# Patient Record
Sex: Male | Born: 1960 | Race: White | Hispanic: No | Smoking: Current every day smoker
Health system: Southern US, Community
[De-identification: ages and names within clinical notes are randomized; demographics above are authoritative.]

## PROBLEM LIST (undated history)

## (undated) DIAGNOSIS — E7800 Pure hypercholesterolemia, unspecified: Secondary | ICD-10-CM

## (undated) DIAGNOSIS — F0390 Unspecified dementia without behavioral disturbance: Secondary | ICD-10-CM

## (undated) HISTORY — PX: NO PAST SURGERIES: SHX2092

---

## 2010-11-11 ENCOUNTER — Emergency Department: Payer: Self-pay | Admitting: Emergency Medicine

## 2011-02-07 ENCOUNTER — Emergency Department: Payer: Self-pay | Admitting: Unknown Physician Specialty

## 2013-11-27 ENCOUNTER — Ambulatory Visit: Payer: Self-pay | Admitting: Family Medicine

## 2014-04-03 ENCOUNTER — Ambulatory Visit: Payer: Self-pay | Admitting: Neurology

## 2016-01-26 ENCOUNTER — Other Ambulatory Visit: Payer: Self-pay | Admitting: Family Medicine

## 2016-01-26 DIAGNOSIS — M8580 Other specified disorders of bone density and structure, unspecified site: Secondary | ICD-10-CM

## 2016-03-01 ENCOUNTER — Ambulatory Visit: Payer: Self-pay | Attending: Family Medicine

## 2017-04-19 ENCOUNTER — Other Ambulatory Visit: Payer: Self-pay | Admitting: Nurse Practitioner

## 2017-04-19 DIAGNOSIS — F0391 Unspecified dementia with behavioral disturbance: Secondary | ICD-10-CM

## 2017-05-23 ENCOUNTER — Ambulatory Visit: Payer: Self-pay

## 2017-06-27 ENCOUNTER — Ambulatory Visit: Payer: Self-pay

## 2017-10-17 ENCOUNTER — Other Ambulatory Visit: Payer: Self-pay | Admitting: Family Medicine

## 2017-10-17 ENCOUNTER — Ambulatory Visit
Admission: RE | Admit: 2017-10-17 | Discharge: 2017-10-17 | Disposition: A | Discharge status: Home / Self Care | Payer: Medicare Other | Source: Ambulatory Visit | Attending: Family Medicine | Admitting: Family Medicine

## 2017-10-17 DIAGNOSIS — M25562 Pain in left knee: Secondary | ICD-10-CM | POA: Insufficient documentation

## 2017-10-17 DIAGNOSIS — G8929 Other chronic pain: Secondary | ICD-10-CM

## 2018-09-11 ENCOUNTER — Emergency Department
Admission: EM | Admit: 2018-09-11 | Discharge: 2018-09-11 | Disposition: A | Discharge status: Home / Self Care | Payer: Medicare Other | Attending: Emergency Medicine | Admitting: Emergency Medicine

## 2018-09-11 ENCOUNTER — Other Ambulatory Visit: Payer: Self-pay

## 2018-09-11 ENCOUNTER — Emergency Department: Payer: Medicare Other

## 2018-09-11 ENCOUNTER — Encounter: Payer: Self-pay | Admitting: Emergency Medicine

## 2018-09-11 DIAGNOSIS — K047 Periapical abscess without sinus: Secondary | ICD-10-CM | POA: Insufficient documentation

## 2018-09-11 DIAGNOSIS — R220 Localized swelling, mass and lump, head: Secondary | ICD-10-CM | POA: Diagnosis present

## 2018-09-11 DIAGNOSIS — Z7982 Long term (current) use of aspirin: Secondary | ICD-10-CM | POA: Insufficient documentation

## 2018-09-11 DIAGNOSIS — F0390 Unspecified dementia without behavioral disturbance: Secondary | ICD-10-CM | POA: Diagnosis not present

## 2018-09-11 DIAGNOSIS — Z79899 Other long term (current) drug therapy: Secondary | ICD-10-CM | POA: Insufficient documentation

## 2018-09-11 HISTORY — DX: Pure hypercholesterolemia, unspecified: E78.00

## 2018-09-11 HISTORY — DX: Unspecified dementia, unspecified severity, without behavioral disturbance, psychotic disturbance, mood disturbance, and anxiety: F03.90

## 2018-09-11 LAB — CBC WITH DIFFERENTIAL/PLATELET
Abs Immature Granulocytes: 0.06 10*3/uL (ref 0.00–0.07)
Basophils Absolute: 0.1 10*3/uL (ref 0.0–0.1)
Basophils Relative: 0 %
Eosinophils Absolute: 0 10*3/uL (ref 0.0–0.5)
Eosinophils Relative: 0 %
HCT: 41.1 % (ref 39.0–52.0)
Hemoglobin: 13.8 g/dL (ref 13.0–17.0)
Immature Granulocytes: 0 %
Lymphocytes Relative: 20 %
Lymphs Abs: 2.8 10*3/uL (ref 0.7–4.0)
MCH: 31.4 pg (ref 26.0–34.0)
MCHC: 33.6 g/dL (ref 30.0–36.0)
MCV: 93.4 fL (ref 80.0–100.0)
Monocytes Absolute: 1 10*3/uL (ref 0.1–1.0)
Monocytes Relative: 7 %
Neutro Abs: 10.1 10*3/uL — ABNORMAL HIGH (ref 1.7–7.7)
Neutrophils Relative %: 73 %
Platelets: 285 10*3/uL (ref 150–400)
RBC: 4.4 MIL/uL (ref 4.22–5.81)
RDW: 13.1 % (ref 11.5–15.5)
WBC: 14 10*3/uL — ABNORMAL HIGH (ref 4.0–10.5)
nRBC: 0 % (ref 0.0–0.2)

## 2018-09-11 LAB — BASIC METABOLIC PANEL
Anion gap: 11 (ref 5–15)
BUN: 12 mg/dL (ref 6–20)
CO2: 20 mmol/L — ABNORMAL LOW (ref 22–32)
Calcium: 9.3 mg/dL (ref 8.9–10.3)
Chloride: 107 mmol/L (ref 98–111)
Creatinine, Ser: 1.4 mg/dL — ABNORMAL HIGH (ref 0.61–1.24)
GFR calc Af Amer: >60 mL/min (ref >60)
GFR calc non Af Amer: 56 mL/min — ABNORMAL LOW (ref >60)
Glucose, Bld: 91 mg/dL (ref 70–99)
Potassium: 3.6 mmol/L (ref 3.5–5.1)
Sodium: 138 mmol/L (ref 135–145)

## 2018-09-11 MED ORDER — CLINDAMYCIN HCL 300 MG PO CAPS: 300.0000 mg | ORAL_CAPSULE | Freq: Three times a day (TID) | ORAL | 0 refills | Status: AC

## 2018-09-11 MED ORDER — CLINDAMYCIN PHOSPHATE 600 MG/50ML IV SOLN
600.0000 mg | Freq: Once | INTRAVENOUS | Status: AC
  Administered 2018-09-11: 600 mg via INTRAVENOUS
  Filled 2018-09-11: qty 50

## 2018-09-11 MED ORDER — IOHEXOL 300 MG/ML  SOLN
75.0000 mL | Freq: Once | INTRAMUSCULAR | Status: AC | PRN
  Administered 2018-09-11: 75 mL via INTRAVENOUS
  Filled 2018-09-11: qty 75

## 2018-09-11 NOTE — ED Notes (Signed)
See triage note  Here for left side facial swelling    Thinks he may have an abscess tooth  No fever

## 2018-09-11 NOTE — ED Provider Notes (Signed)
[facility] Emergency Department Provider Note  ____________________________________________  Time seen: Approximately 5:18 PM  I have reviewed the triage vital signs and the nursing notes.   HISTORY  Chief Complaint Facial Swelling    HPI John Mclean is a 58 y.o. male presents to the emergency department with left-sided facial edema and erythema for the past 2-3 days.  Patient reports that he has a broken Superior 14 and has been unable to chew on the affected side.  Patient noticed redness of the left cheek that has extended upward to the left-sided periocular skin.  Patient has had no pain with extraocular eye muscle movement or pressure behind the eye.  He denies increased tearing, photophobia or changes in vision.  Patient has no history of periorbital cellulitis. No alleviating measures have been attempted.    Past Medical History:  Diagnosis Date  . Dementia ([place])   . Hypercholesteremia     There are no active problems to display for this patient.   History reviewed. No pertinent surgical history.  Prior to Admission medications   Medication Sig Start Date End Date Taking? Authorizing Provider  aspirin 81 MG chewable tablet Chew 81 mg by mouth daily.   Yes [provider]  atorvastatin (LIPITOR) 40 MG tablet Take 40 mg by mouth daily.   Yes [provider]  donepezil (ARICEPT) 10 MG tablet Take 10 mg by mouth at bedtime.   Yes [provider]  lamoTRIgine (LAMICTAL) 100 MG tablet Take 100 mg by mouth daily.   Yes [provider]  clindamycin (CLEOCIN) 300 MG capsule Take 1 capsule (300 mg total) by mouth 3 (three) times daily for 10 days. 09/11/18 09/21/18  Lannie Fields, PA-C    Allergies Patient has no known allergies.  History reviewed. No pertinent family history.  Social History Social History   Tobacco Use  . Smoking status: Never Smoker  . Smokeless tobacco: Never Used  Substance Use  Topics  . Alcohol use: Never    Frequency: Never  . Drug use: Never     Review of Systems  Constitutional: No fever/chills Eyes: No visual changes. No discharge ENT: Patient has dental pain and facial swelling.  Cardiovascular: no chest pain. Respiratory: no cough. No SOB. Gastrointestinal: No abdominal pain.  No nausea, no vomiting.  No diarrhea.  No constipation. Musculoskeletal: Negative for musculoskeletal pain. Skin: Patient has facial erythema.  Neurological: Negative for headaches, focal weakness or numbness.  ____________________________________________   PHYSICAL EXAM:  VITAL SIGNS: ED Triage Vitals  Enc Vitals Group     BP 09/11/18 1618 124/68     Pulse Rate 09/11/18 1618 100     Resp 09/11/18 1618 18     Temp 09/11/18 1618 98.8 F (37.1 C)     Temp Source 09/11/18 1618 Oral     SpO2 09/11/18 1618 95 %     Weight 09/11/18 1615 220 lb (99.8 kg)     Height 09/11/18 1615 5\' 10"  (1.778 m)     Head Circumference --      Peak Flow --      Pain Score 09/11/18 1614 8     Pain Loc --      Pain Edu? --      Excl. in [place]? --      Constitutional: Alert and oriented. Well appearing and in no acute distress. Eyes: Conjunctivae are normal. No pain with extraocular eye muscle movement.  PERRL. EOMI. Head: Atraumatic. ENT:  Ears: TMs are pearly.      Nose: No congestion/rhinnorhea.      Mouth/Throat: Mucous membranes are moist.  Patient has broken superior 14.  Patient has left-sided facial swelling and erythema. Neck: No stridor.  No cervical spine tenderness to palpation. Cardiovascular: Normal rate, regular rhythm. Normal S1 and S2.  Good peripheral circulation. Respiratory: Normal respiratory effort without tachypnea or retractions. Lungs CTAB. Good air entry to the bases with no decreased or absent breath sounds. Gastrointestinal: Bowel sounds 4 quadrants. Soft and nontender to palpation. No guarding or rigidity. No palpable masses. No distention. No CVA  tenderness. Musculoskeletal: Full range of motion to all extremities. No gross deformities appreciated. Neurologic:  Normal speech and language. No gross focal neurologic deficits are appreciated.  Psychiatric: Mood and affect are normal. Speech and behavior are normal. Patient exhibits appropriate insight and judgement.   ____________________________________________   LABS (all labs ordered are listed, but only abnormal results are displayed)  Labs Reviewed  CBC WITH DIFFERENTIAL/PLATELET - Abnormal; Notable for the following components:      Result Value   WBC 14.0 (*)    Neutro Abs 10.1 (*)    All other components within normal limits  BASIC METABOLIC PANEL - Abnormal; Notable for the following components:   CO2 20 (*)    Creatinine, Ser 1.40 (*)    GFR calc non Af Amer 56 (*)    All other components within normal limits   ____________________________________________  EKG   ____________________________________________  RADIOLOGY I personally viewed and evaluated these images as part of my medical decision making, as well as reviewing the written report by the radiologist.    Ct Maxillofacial W Contrast  Result Date: 09/11/2018 CLINICAL DATA:  Facial swelling EXAM: CT MAXILLOFACIAL WITH CONTRAST TECHNIQUE: Multidetector CT imaging of the maxillofacial structures was performed with intravenous contrast. Multiplanar CT image reconstructions were also generated. CONTRAST:  63mL OMNIPAQUE IOHEXOL 300 MG/ML  SOLN COMPARISON:  None. FINDINGS: Osseous: Poor dentition with multiple periapical erosions, most notably tooth number 12 which has an associated subperiosteal abscess on the buccal surface measuring 4 mm in thickness by 16 mm in length. There is regional facial cellulitis. No floor of mouth swelling. Orbits: Negative Sinuses: Mucosal thickening on the floor of the left maxillary sinus, potentially odontogenic. Right maxillary sclerotic wall thickening from prior inflammation.  Soft tissues: Cellulitis in the left face as noted above Limited intracranial: Age advanced cerebral volume loss. IMPRESSION: Odontogenic infection in the left face related to tooth 12 with 16 x 4 mm subperiosteal abscess on the buccal surface. Electronically Signed   By: Monte Fantasia M.D.   On: 09/11/2018 18:44    ____________________________________________    PROCEDURES  Procedure(s) performed:    Procedures    Medications  clindamycin (CLEOCIN) IVPB 600 mg (0 mg Intravenous Stopped 09/11/18 1826)  iohexol (OMNIPAQUE) 300 MG/ML solution 75 mL (75 mLs Intravenous Contrast Given 09/11/18 1826)     ____________________________________________   INITIAL IMPRESSION / ASSESSMENT AND PLAN / ED COURSE  Pertinent labs & imaging results that were available during my care of the patient were reviewed by me and considered in my medical decision making (see chart for details).  Review of the [place] CSRS was performed in accordance of the [place] prior to dispensing any controlled drugs.      Assessment and plan Dental abscess Facial cellulitis Patient presents to the emergency department with dental pain surrounding superior 12 with left-sided facial cellulitis.  Patient has had symptoms  for the past 2 to 3 days.  CT maxillofacial reveals a subperiosteal abscess surrounding superior 12.  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  Patient was given IV clindamycin in the emergency department.  He was discharged with clindamycin.  Vital signs were reassuring prior to discharge.  All patient questions were answered.     ____________________________________________  FINAL CLINICAL IMPRESSION(S) / ED DIAGNOSES  Final diagnoses:  Dental abscess      NEW MEDICATIONS STARTED DURING THIS VISIT:  ED Discharge Orders         Ordered    clindamycin (CLEOCIN) 300 MG capsule  3 times daily     09/11/18 1941              This chart was dictated using voice recognition  software/Dragon. Despite best efforts to proofread, errors can occur which can change the meaning. Any change was purely unintentional.    Lannie Fields, PA-C 09/11/18 Elnita Maxwell, MD 09/12/18 [phone]

## 2018-09-11 NOTE — Discharge Instructions (Signed)
Take clindamycin 3 times a day at breakfast, lunch and dinner.  Please consume yogurt often while taking clindamycin. A probiotic that you can find at [facility], [facility], [facility] or [facility] is often helpful for reintroducing good gut bacteria back into your system.

## 2018-09-11 NOTE — ED Triage Notes (Signed)
Here for left facial swelling. Appears to have abscess tooth that has spread into cheek. No fever. No pain in eye. No vision changes. Does not appear to have periorbital cellulitis symptoms at this time.  Ambulatory. Some mild dementia. Family with pt.

## 2018-10-30 ENCOUNTER — Other Ambulatory Visit: Payer: Self-pay

## 2018-10-30 ENCOUNTER — Encounter: Payer: Self-pay | Admitting: Family Medicine

## 2018-10-30 DIAGNOSIS — Z1211 Encounter for screening for malignant neoplasm of colon: Secondary | ICD-10-CM

## 2018-11-05 ENCOUNTER — Telehealth: Payer: Self-pay | Admitting: Gastroenterology

## 2018-11-05 NOTE — Telephone Encounter (Signed)
Pt mother  Left vm regarding pt up coming procedure please call her

## 2018-11-05 NOTE — Telephone Encounter (Signed)
Patients mother contacted office to cancel her son's colonoscopy due to insurance concerns.  She said she needs to call the insurance company to see if they will cover the procedure before having it done.  She will call back to schedule her son's procedure once she checks with the insurance.  Thanks [employer]

## 2018-11-07 ENCOUNTER — Telehealth: Payer: Self-pay | Admitting: Gastroenterology

## 2018-11-07 NOTE — Telephone Encounter (Signed)
Returned [employer].  Left message with Staci to let her know I returned the call and she can call me back when she gets back home.  Thanks [employer]

## 2018-11-07 NOTE — Telephone Encounter (Signed)
Pt mother left vm to r/s pt procedure she ask that [place] call her back

## 2018-11-15 ENCOUNTER — Other Ambulatory Visit: Payer: Self-pay

## 2018-11-15 ENCOUNTER — Telehealth: Payer: Self-pay | Admitting: Gastroenterology

## 2018-11-15 DIAGNOSIS — Z1211 Encounter for screening for malignant neoplasm of colon: Secondary | ICD-10-CM

## 2018-11-15 MED ORDER — NA SULFATE-K SULFATE-MG SULF 17.5-3.13-1.6 GM/177ML PO SOLN: 1.0000 | Freq: Once | ORAL | 0 refills | Status: AC

## 2018-11-15 NOTE — Telephone Encounter (Signed)
SHARON LEFT VM TO SCHEDULE PT PROCEDURE

## 2018-11-18 ENCOUNTER — Ambulatory Visit: Admit: 2018-11-18 | Payer: Medicare Other | Admitting: Gastroenterology

## 2018-11-18 SURGERY — COLONOSCOPY WITH PROPOFOL
Anesthesia: Choice

## 2018-11-20 ENCOUNTER — Encounter: Payer: Self-pay | Admitting: Anesthesiology

## 2018-11-20 ENCOUNTER — Other Ambulatory Visit: Payer: Self-pay

## 2018-11-20 ENCOUNTER — Encounter: Payer: Self-pay | Admitting: *Deleted

## 2018-11-27 ENCOUNTER — Other Ambulatory Visit: Payer: Self-pay

## 2018-11-27 DIAGNOSIS — K625 Hemorrhage of anus and rectum: Secondary | ICD-10-CM

## 2018-11-29 ENCOUNTER — Encounter
Admission: RE | Disposition: A | Discharge status: Home / Self Care | Payer: Self-pay | Source: Home / Self Care | Attending: Gastroenterology

## 2018-11-29 ENCOUNTER — Ambulatory Visit: Payer: Medicare Other | Admitting: Anesthesiology

## 2018-11-29 ENCOUNTER — Ambulatory Visit: Admission: RE | Admit: 2018-11-29 | Payer: Medicare Other | Source: Home / Self Care | Admitting: Gastroenterology

## 2018-11-29 ENCOUNTER — Encounter: Payer: Self-pay | Admitting: *Deleted

## 2018-11-29 ENCOUNTER — Ambulatory Visit
Admission: RE | Admit: 2018-11-29 | Discharge: 2018-11-29 | Disposition: A | Discharge status: Home / Self Care | Payer: Medicare Other | Attending: Gastroenterology | Admitting: Gastroenterology

## 2018-11-29 DIAGNOSIS — F17210 Nicotine dependence, cigarettes, uncomplicated: Secondary | ICD-10-CM | POA: Insufficient documentation

## 2018-11-29 DIAGNOSIS — Z79899 Other long term (current) drug therapy: Secondary | ICD-10-CM | POA: Insufficient documentation

## 2018-11-29 DIAGNOSIS — K921 Melena: Secondary | ICD-10-CM | POA: Diagnosis present

## 2018-11-29 DIAGNOSIS — D122 Benign neoplasm of ascending colon: Secondary | ICD-10-CM | POA: Diagnosis not present

## 2018-11-29 DIAGNOSIS — Z7982 Long term (current) use of aspirin: Secondary | ICD-10-CM | POA: Diagnosis not present

## 2018-11-29 DIAGNOSIS — F0390 Unspecified dementia without behavioral disturbance: Secondary | ICD-10-CM | POA: Insufficient documentation

## 2018-11-29 DIAGNOSIS — K5730 Diverticulosis of large intestine without perforation or abscess without bleeding: Secondary | ICD-10-CM | POA: Insufficient documentation

## 2018-11-29 DIAGNOSIS — K641 Second degree hemorrhoids: Secondary | ICD-10-CM | POA: Insufficient documentation

## 2018-11-29 DIAGNOSIS — E7800 Pure hypercholesterolemia, unspecified: Secondary | ICD-10-CM | POA: Insufficient documentation

## 2018-11-29 DIAGNOSIS — K625 Hemorrhage of anus and rectum: Secondary | ICD-10-CM

## 2018-11-29 HISTORY — PX: COLONOSCOPY WITH PROPOFOL: SHX5780

## 2018-11-29 SURGERY — COLONOSCOPY WITH PROPOFOL
Anesthesia: Choice

## 2018-11-29 SURGERY — COLONOSCOPY WITH PROPOFOL
Anesthesia: General

## 2018-11-29 MED ORDER — FENTANYL CITRATE (PF) 100 MCG/2ML IJ SOLN
INTRAMUSCULAR | Status: AC
  Filled 2018-11-29: qty 2

## 2018-11-29 MED ORDER — FENTANYL CITRATE (PF) 100 MCG/2ML IJ SOLN
INTRAMUSCULAR | Status: DC | PRN
  Administered 2018-11-29 (×2): 50 ug via INTRAVENOUS

## 2018-11-29 MED ORDER — LIDOCAINE 2% (20 MG/ML) 5 ML SYRINGE
INTRAMUSCULAR | Status: DC | PRN
  Administered 2018-11-29: 25 mg via INTRAVENOUS

## 2018-11-29 MED ORDER — PROPOFOL 500 MG/50ML IV EMUL
INTRAVENOUS | Status: DC | PRN
  Administered 2018-11-29: 120 ug/kg/min via INTRAVENOUS

## 2018-11-29 MED ORDER — SODIUM CHLORIDE 0.9 % IV SOLN
INTRAVENOUS | Status: DC
  Administered 2018-11-29: 10:00:00 via INTRAVENOUS

## 2018-11-29 MED ORDER — PROPOFOL 10 MG/ML IV BOLUS
INTRAVENOUS | Status: DC | PRN
  Administered 2018-11-29: 70 mg via INTRAVENOUS
  Administered 2018-11-29: 30 mg via INTRAVENOUS

## 2018-11-29 NOTE — Anesthesia Post-op Follow-up Note (Signed)
Anesthesia QCDR form completed.        

## 2018-11-29 NOTE — Transfer of Care (Signed)
Immediate Anesthesia Transfer of Care Note  Patient: John Mclean  Procedure(s) Performed: COLONOSCOPY WITH PROPOFOL (N/A )  Patient Location: Endoscopy Unit  Anesthesia Type:General  Level of Consciousness: sedated  Airway & Oxygen Therapy: Patient Spontanous Breathing and Patient connected to nasal cannula oxygen  Post-op Assessment: Report given to RN and Post -op Vital signs reviewed and stable  Post vital signs: Reviewed  Last Vitals:  Vitals Value Taken Time  BP 88/65 11/29/2018 10:27 AM  Temp 36.2 C 11/29/2018 10:27 AM  Pulse 74 11/29/2018 10:28 AM  Resp 12 11/29/2018 10:28 AM  SpO2 95 % 11/29/2018 10:28 AM  Vitals shown include unvalidated device data.  Last Pain:  Vitals:   11/29/18 1020  TempSrc: Tympanic  PainSc:          Complications: No apparent anesthesia complications

## 2018-11-29 NOTE — Anesthesia Postprocedure Evaluation (Signed)
Anesthesia Post Note  Patient: John Mclean  Procedure(s) Performed: COLONOSCOPY WITH PROPOFOL (N/A )  Patient location during evaluation: PACU Anesthesia Type: General Level of consciousness: awake and alert Pain management: pain level controlled Vital Signs Assessment: post-procedure vital signs reviewed and stable Respiratory status: spontaneous breathing, nonlabored ventilation and respiratory function stable Cardiovascular status: blood pressure returned to baseline and stable Postop Assessment: no apparent nausea or vomiting Anesthetic complications: no     Last Vitals:  Vitals:   11/29/18 1040 11/29/18 1050  BP: 97/71 107/85  Pulse: 66 67  Resp: 19 17  Temp:    SpO2: 100% 98%    Last Pain:  Vitals:   11/29/18 1020  TempSrc: Tympanic  PainSc:                  [name]

## 2018-11-29 NOTE — Anesthesia Preprocedure Evaluation (Addendum)
Anesthesia Evaluation  Patient identified by MRN, date of birth, ID band Patient awake    Reviewed: Allergy & Precautions, H&P , NPO status , Patient's Chart, lab work & pertinent test results  Airway Mallampati: III  TM Distance: >3 FB     Dental  (+) Poor Dentition   Pulmonary neg COPD, neg recent URI, Current Smoker,           Cardiovascular (-) angina(-) Past MI negative cardio ROS  (-) dysrhythmias      Neuro/Psych PSYCHIATRIC DISORDERS Dementia negative neurological ROS     GI/Hepatic negative GI ROS, Neg liver ROS,   Endo/Other  negative endocrine ROS  Renal/GU negative Renal ROS  negative genitourinary   Musculoskeletal   Abdominal   Peds  Hematology negative hematology ROS (+)   Anesthesia Other Findings Obese  Past Medical History: No date: Dementia ([place]) No date: Hypercholesteremia  Past Surgical History: No date: NO PAST SURGERIES  BMI    Body Mass Index:  36.48 kg/m      Reproductive/Obstetrics negative OB ROS                            Anesthesia Physical Anesthesia Plan  ASA: III  Anesthesia Plan: General   Post-op Pain Management:    Induction:   PONV Risk Score and Plan: Propofol infusion and TIVA  Airway Management Planned: Nasal Cannula  Additional Equipment:   Intra-op Plan:   Post-operative Plan:   Informed Consent: I have reviewed the patients History and Physical, chart, labs and discussed the procedure including the risks, benefits and alternatives for the proposed anesthesia with the patient or authorized representative who has indicated his/her understanding and acceptance.     Dental Advisory Given  Plan Discussed with: Anesthesiologist and CRNA  Anesthesia Plan Comments:         Anesthesia Quick Evaluation

## 2018-11-29 NOTE — H&P (Signed)
Lucilla Lame, MD [address] [place], [place] [address] [phone] Fax : [phone]  Primary Care Physician:  Theotis Burrow, MD Primary Gastroenterologist:  Dr. Allen Norris  Pre-Procedure History & Physical: HPI:  John Mclean is a 58 y.o. male is here for an colonoscopy.   Past Medical History:  Diagnosis Date  . Dementia ([place])   . Hypercholesteremia     Past Surgical History:  Procedure Laterality Date  . NO PAST SURGERIES      Prior to Admission medications   Medication Sig Start Date End Date Taking? Authorizing Provider  aspirin 81 MG chewable tablet Chew 81 mg by mouth daily.   Yes [provider]  atorvastatin (LIPITOR) 40 MG tablet Take 40 mg by mouth daily.   Yes [provider]  donepezil (ARICEPT) 10 MG tablet Take 10 mg by mouth at bedtime.   Yes [provider]  lamoTRIgine (LAMICTAL) 100 MG tablet Take 100 mg by mouth daily.   Yes [provider]  Multiple Vitamins-Minerals (CENTRUM SILVER PO) Take by mouth daily.   Yes [provider]    Allergies as of 11/28/2018  . (No Known Allergies)    History reviewed. No pertinent family history.  Social History   Socioeconomic History  . Marital status: Widowed    Spouse name: Not on file  . Number of children: Not on file  . Years of education: Not on file  . Highest education level: Not on file  Occupational History  . Not on file  Social Needs  . Financial resource strain: Not on file  . Food insecurity:    Worry: Not on file    Inability: Not on file  . Transportation needs:    Medical: Not on file    Non-medical: Not on file  Tobacco Use  . Smoking status: Current Every Day Smoker    Packs/day: 1.00    Types: Cigarettes  . Smokeless tobacco: Never Used  Substance and Sexual Activity  . Alcohol use: Never    Frequency: Never  . Drug use: Never  . Sexual activity: Not on file  Lifestyle  . Physical activity:   Days per week: Not on file    Minutes per session: Not on file  . Stress: Not on file  Relationships  . Social connections:    Talks on phone: Not on file    Gets together: Not on file    Attends religious service: Not on file    Active member of club or organization: Not on file    Attends meetings of clubs or organizations: Not on file    Relationship status: Not on file  . Intimate partner violence:    Fear of current or ex partner: Not on file    Emotionally abused: Not on file    Physically abused: Not on file    Forced sexual activity: Not on file  Other Topics Concern  . Not on file  Social History Narrative  . Not on file    Review of Systems: See HPI, otherwise negative ROS  Physical Exam: BP 127/75   Pulse 74   Temp (!) 97.2 F (36.2 C) (Tympanic)   Resp 18   Ht 5\' 9"  (1.753 m)   Wt 112 kg   SpO2 92%   BMI 36.48 kg/m  General:   Alert,  pleasant and cooperative in NAD Head:  Normocephalic and atraumatic. Neck:  Supple; no masses or thyromegaly. Lungs:  Clear throughout  to auscultation.    Heart:  Regular rate and rhythm. Abdomen:  Soft, nontender and nondistended. Normal bowel sounds, without guarding, and without rebound.   Neurologic:  Alert and  oriented x4;  grossly normal neurologically.  Impression/Plan: JACORIAN GOLASZEWSKI is here for an colonoscopy to be performed for hematochezia  Risks, benefits, limitations, and alternatives regarding  colonoscopy have been reviewed with the patient.  Questions have been answered.  All parties agreeable.   Lucilla Lame, MD  11/29/2018, 10:27 AM

## 2018-11-29 NOTE — Op Note (Signed)
[facility] Gastroenterology Patient Name: John Mclean Procedure Date: 11/29/2018 9:59 AM MRN: [id number] Account #: [id number] Date of Birth: 1961-04-19 Admit Type: Outpatient Age: 58 Room: [facility] ENDO ROOM 3 Gender: Male Note Status: Finalized Procedure:            Colonoscopy Indications:          Hematochezia Providers:            Lucilla Lame MD, MD Referring MD:         Elyse Jarvis Revelo (Referring MD) Medicines:            Propofol per Anesthesia Complications:        No immediate complications. Procedure:            Pre-Anesthesia Assessment:                       - Prior to the procedure, a History and Physical was                        performed, and patient medications and allergies were                        reviewed. The patient's tolerance of previous                        anesthesia was also reviewed. The risks and benefits of                        the procedure and the sedation options and risks were                        discussed with the patient. All questions were                        answered, and informed consent was obtained. Prior                        Anticoagulants: The patient has taken no previous                        anticoagulant or antiplatelet agents. ASA Grade                        Assessment: II - A patient with mild systemic disease.                        After reviewing the risks and benefits, the patient was                        deemed in satisfactory condition to undergo the                        procedure.                       After obtaining informed consent, the colonoscope was                        passed under direct vision. Throughout the procedure,  the patient's blood pressure, pulse, and oxygen                        saturations were monitored continuously. The                        Colonoscope was introduced through the anus and                        advanced to the the  cecum, identified by appendiceal                        orifice and ileocecal valve. The colonoscopy was                        performed without difficulty. The patient tolerated the                        procedure well. The quality of the bowel preparation                        was excellent. Findings:      The perianal and digital rectal examinations were normal.      Two sessile polyps were found in the ascending colon. The polyps were 2       to 8 mm in size. These polyps were removed with a cold snare. Resection       and retrieval were complete.      Multiple small-mouthed diverticula were found in the sigmoid colon.      Non-bleeding internal hemorrhoids were found during retroflexion. The       hemorrhoids were Grade II (internal hemorrhoids that prolapse but reduce       spontaneously). Impression:           - Two 2 to 8 mm polyps in the ascending colon, removed                        with a cold snare. Resected and retrieved.                       - Diverticulosis in the sigmoid colon.                       - Non-bleeding internal hemorrhoids. Recommendation:       - Discharge patient to home.                       - Resume previous diet.                       - Continue present medications.                       - Await pathology results.                       - Repeat colonoscopy in 5 years if polyp adenoma and 10                        years if hyperplastic Procedure Code(s):    --- Professional ---  45385, Colonoscopy, flexible; with removal of tumor(s),                        polyp(s), or other lesion(s) by snare technique Diagnosis Code(s):    --- Professional ---                       K92.1, Melena (includes Hematochezia)                       D12.2, Benign neoplasm of ascending colon CPT copyright 2018 American Medical Association. All rights reserved. The codes documented in this report are preliminary and upon coder review may  be revised to  meet current compliance requirements. Lucilla Lame MD, MD 11/29/2018 10:24:58 AM This report has been signed electronically. Number of Addenda: 0 Note Initiated On: 11/29/2018 9:59 AM Scope Withdrawal Time: 0 hours 8 minutes 40 seconds  Total Procedure Duration: 0 hours 13 minutes 2 seconds       [facility]

## 2018-12-02 ENCOUNTER — Encounter: Payer: Self-pay | Admitting: Gastroenterology

## 2018-12-02 LAB — SURGICAL PATHOLOGY

## 2018-12-06 ENCOUNTER — Encounter: Payer: Self-pay | Admitting: Gastroenterology

## 2019-06-07 IMAGING — CT CT MAXILLOFACIAL W/ CM
3 series · 16 of 47 positions shown, 19 images · IV contrast (omnipaque)
Comparison: None.

CLINICAL DATA: Facial swelling

EXAM:
CT MAXILLOFACIAL WITH CONTRAST
TECHNIQUE: Multidetector CT imaging of the maxillofacial structures was
performed with intravenous contrast. Multiplanar CT image
reconstructions were also generated.
CONTRAST:  75mL OMNIPAQUE IOHEXOL 300 MG/ML  SOLN

[Series 2: max soft · axial · 0.34mm/px · z∈[+114,+252]mm · 10 of 81 slices shown, 13 images]
[im 6/81  brain]
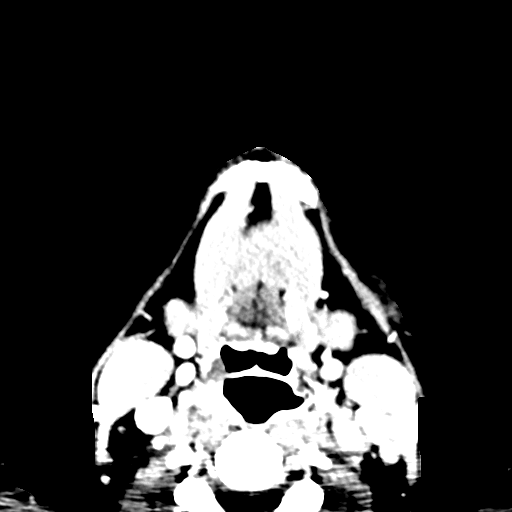
[im 6/81  bone]
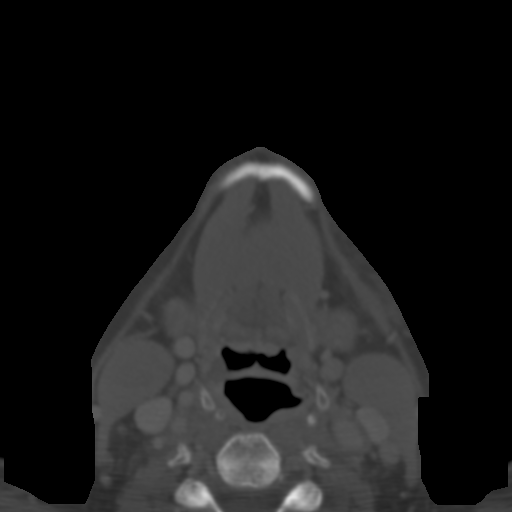
[im 14/81  bone]
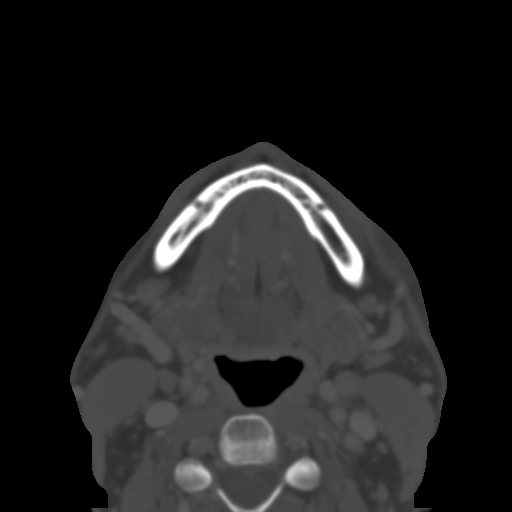
[im 23/81  bone]
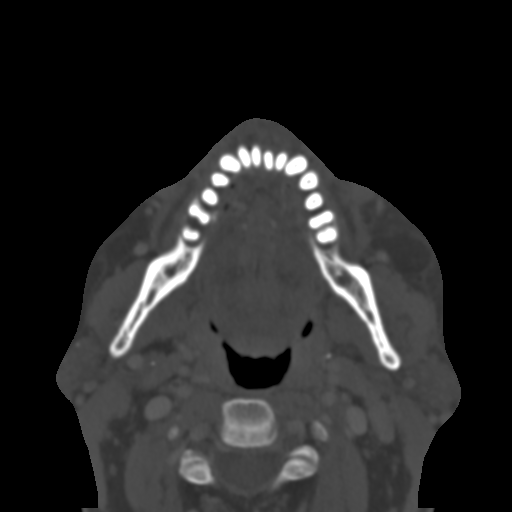
[im 28/81  bone]
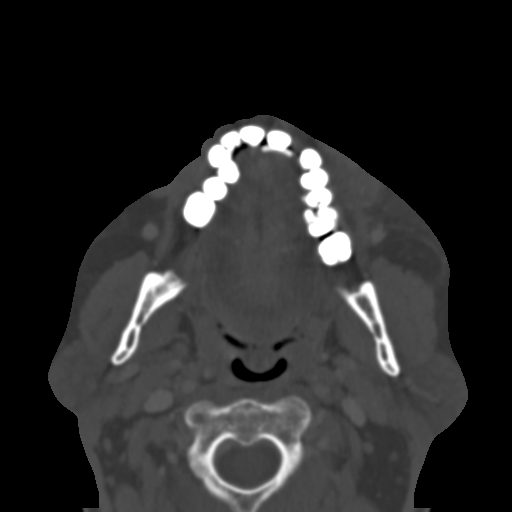
[im 36/81  brain]
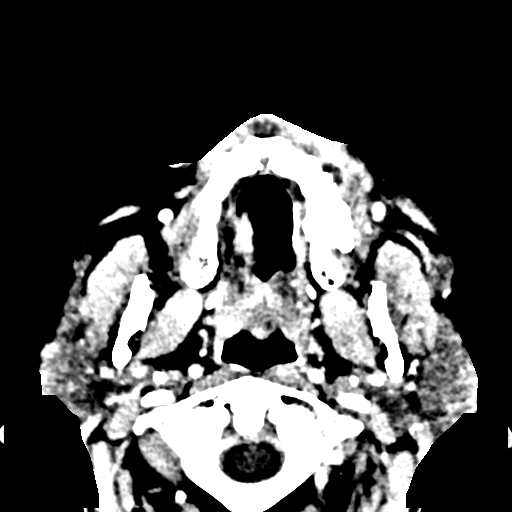
[im 36/81  bone]
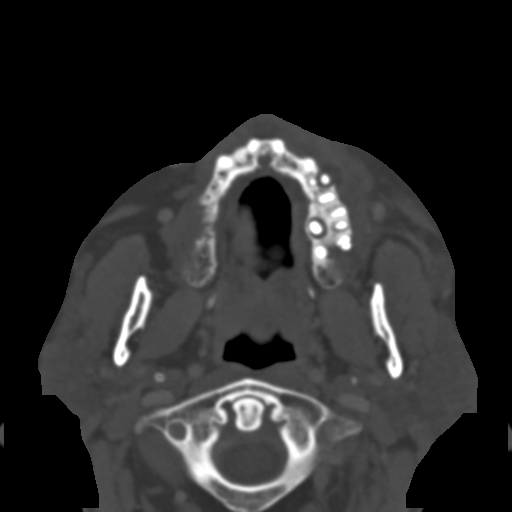
[im 45/81  bone]
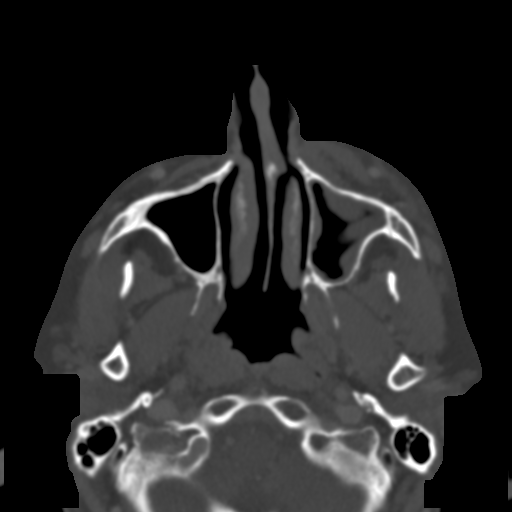
[im 53/81  bone]
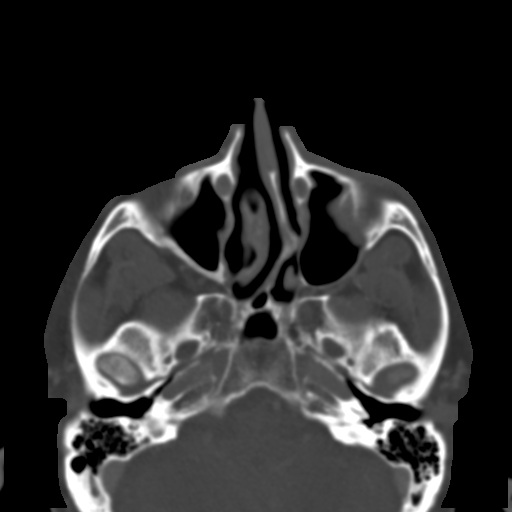
[im 61/81  bone]
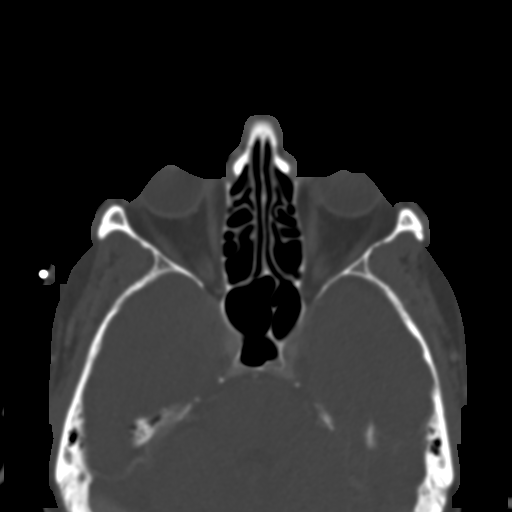
[im 67/81  brain]
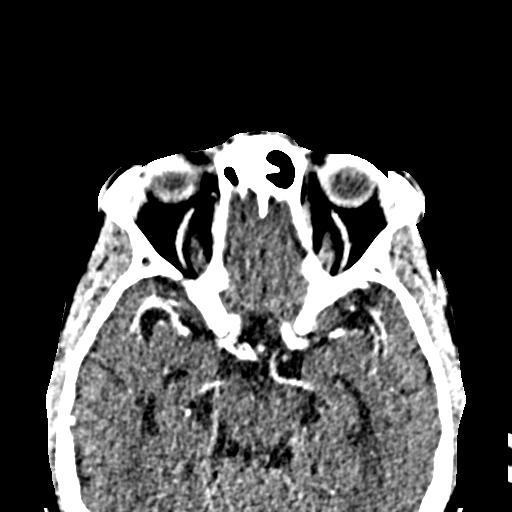
[im 67/81  bone]
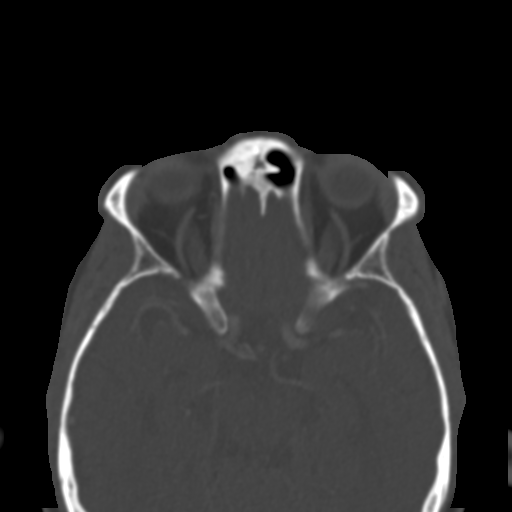
[im 75/81  bone]
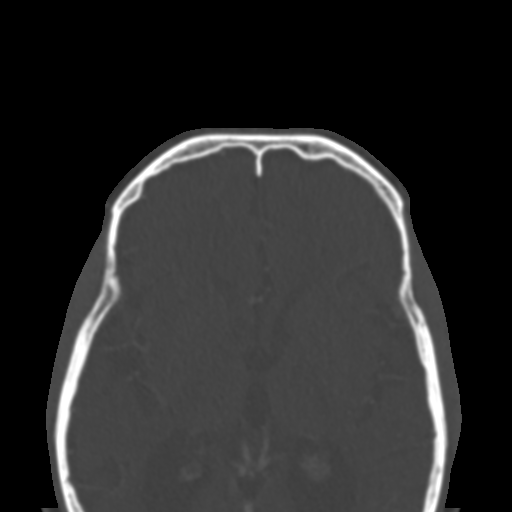

[Series 4: coronal soft · coronal · 0.37mm/px · 3 of 77 slices shown]
[im 26/77  bone]
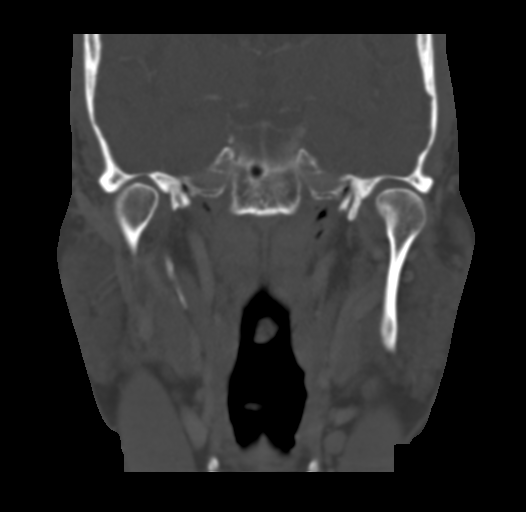
[im 34/77  bone]
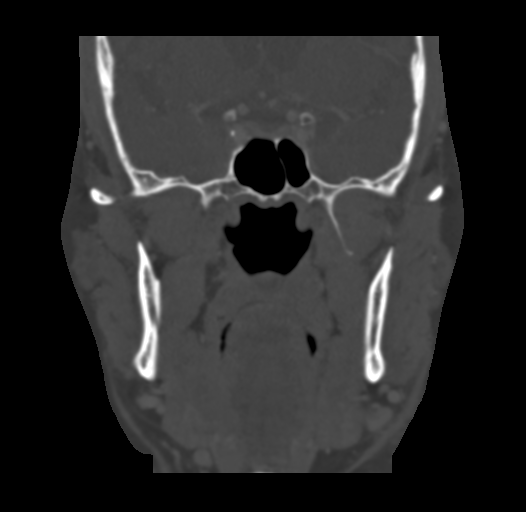
[im 43/77  bone]
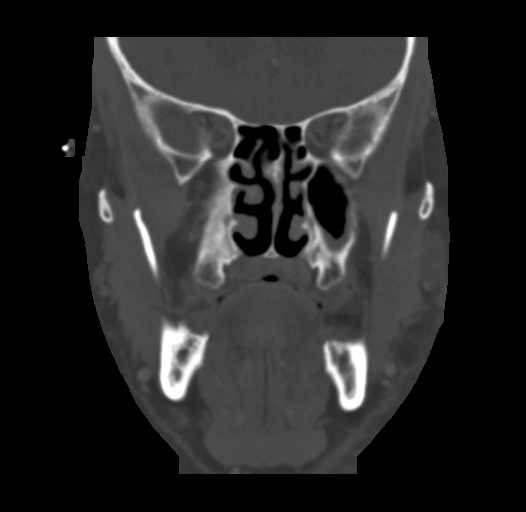

[Series 5: sagittal soft · sagittal · 0.36mm/px · 3 of 78 slices shown]
[im 26/78  bone]
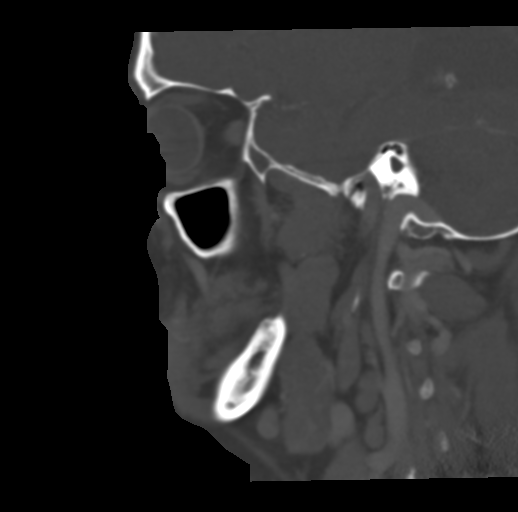
[im 39/78  bone]
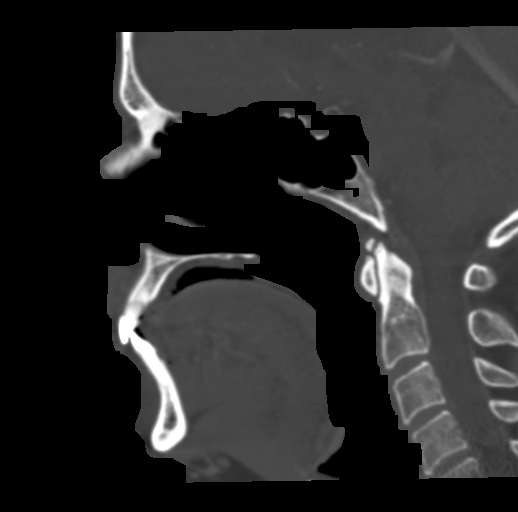
[im 52/78  bone]
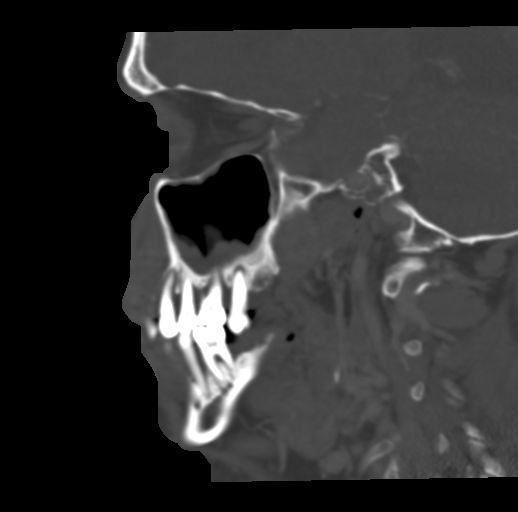

[16 of 47 positions shown; findings below may reference images not displayed]

FINDINGS: Osseous: Poor dentition with multiple periapical erosions, most
notably tooth number 12 which has an associated subperiosteal
abscess on the buccal surface measuring 4 mm in thickness by 16 mm
in length. There is regional facial cellulitis. No floor of mouth
swelling.

Orbits: Negative

Sinuses: Mucosal thickening on the floor of the left maxillary
sinus, potentially odontogenic. Right maxillary sclerotic wall
thickening from prior inflammation.

Soft tissues: Cellulitis in the left face as noted above

Limited intracranial: Age advanced cerebral volume loss.
IMPRESSION: Odontogenic infection in the left face related to tooth 12 with 16 x
4 mm subperiosteal abscess on the buccal surface.

## 2022-09-11 DEATH — deceased
